# Patient Record
Sex: Female | Born: 1980 | Race: Black or African American | Hispanic: No | Marital: Single | Smoking: Never smoker
Health system: Southern US, Community
[De-identification: ages and names within clinical notes are randomized; demographics above are authoritative.]

---

## 2004-04-21 ENCOUNTER — Emergency Department (HOSPITAL_COMMUNITY): Admission: EM | Admit: 2004-04-21 | Discharge: 2004-04-21 | Payer: Self-pay | Admitting: Emergency Medicine

## 2017-10-03 ENCOUNTER — Ambulatory Visit: Payer: Self-pay | Admitting: Advanced Practice Midwife

## 2020-09-09 ENCOUNTER — Emergency Department
Admission: EM | Admit: 2020-09-09 | Discharge: 2020-09-09 | Disposition: A | Discharge status: Home / Self Care | Payer: BC Managed Care – PPO | Attending: Emergency Medicine | Admitting: Emergency Medicine

## 2020-09-09 ENCOUNTER — Other Ambulatory Visit: Payer: Self-pay

## 2020-09-09 ENCOUNTER — Emergency Department: Payer: BC Managed Care – PPO

## 2020-09-09 ENCOUNTER — Encounter: Payer: Self-pay | Admitting: Emergency Medicine

## 2020-09-09 DIAGNOSIS — R102 Pelvic and perineal pain: Secondary | ICD-10-CM

## 2020-09-09 DIAGNOSIS — Z3A00 Weeks of gestation of pregnancy not specified: Secondary | ICD-10-CM | POA: Insufficient documentation

## 2020-09-09 DIAGNOSIS — N939 Abnormal uterine and vaginal bleeding, unspecified: Secondary | ICD-10-CM

## 2020-09-09 DIAGNOSIS — O4691 Antepartum hemorrhage, unspecified, first trimester: Secondary | ICD-10-CM | POA: Diagnosis present

## 2020-09-09 DIAGNOSIS — O4690 Antepartum hemorrhage, unspecified, unspecified trimester: Secondary | ICD-10-CM

## 2020-09-09 LAB — COMPREHENSIVE METABOLIC PANEL
ALT: 26 U/L (ref 0–44)
AST: 28 U/L (ref 15–41)
Albumin: 3.8 g/dL (ref 3.5–5.0)
Alkaline Phosphatase: 53 U/L (ref 38–126)
Anion gap: 8 (ref 5–15)
BUN: 9 mg/dL (ref 6–20)
CO2: 23 mmol/L (ref 22–32)
Calcium: 8.5 mg/dL — ABNORMAL LOW (ref 8.9–10.3)
Chloride: 104 mmol/L (ref 98–111)
Creatinine, Ser: 0.84 mg/dL (ref 0.44–1.00)
GFR, Estimated: >60 mL/min (ref >60)
Glucose, Bld: 103 mg/dL — ABNORMAL HIGH (ref 70–99)
Potassium: 3.4 mmol/L — ABNORMAL LOW (ref 3.5–5.1)
Sodium: 135 mmol/L (ref 135–145)
Total Bilirubin: 0.5 mg/dL (ref 0.3–1.2)
Total Protein: 7.1 g/dL (ref 6.5–8.1)

## 2020-09-09 LAB — CBC WITH DIFFERENTIAL/PLATELET
Abs Immature Granulocytes: 0.01 10*3/uL (ref 0.00–0.07)
Basophils Absolute: 0 10*3/uL (ref 0.0–0.1)
Basophils Relative: 1 %
Eosinophils Absolute: 0 10*3/uL (ref 0.0–0.5)
Eosinophils Relative: 0 %
HCT: 32.9 % — ABNORMAL LOW (ref 36.0–46.0)
Hemoglobin: 11.5 g/dL — ABNORMAL LOW (ref 12.0–15.0)
Immature Granulocytes: 0 %
Lymphocytes Relative: 28 %
Lymphs Abs: 1 10*3/uL (ref 0.7–4.0)
MCH: 29.9 pg (ref 26.0–34.0)
MCHC: 35 g/dL (ref 30.0–36.0)
MCV: 85.5 fL (ref 80.0–100.0)
Monocytes Absolute: 0.3 10*3/uL (ref 0.1–1.0)
Monocytes Relative: 9 %
Neutro Abs: 2.2 10*3/uL (ref 1.7–7.7)
Neutrophils Relative %: 62 %
Platelets: 291 10*3/uL (ref 150–400)
RBC: 3.85 MIL/uL — ABNORMAL LOW (ref 3.87–5.11)
RDW: 14.3 % (ref 11.5–15.5)
WBC: 3.6 10*3/uL — ABNORMAL LOW (ref 4.0–10.5)
nRBC: 0 % (ref 0.0–0.2)

## 2020-09-09 LAB — HCG, QUANTITATIVE, PREGNANCY: hCG, Beta Chain, Quant, S: 4544 m[IU]/mL — ABNORMAL HIGH (ref <5)

## 2020-09-09 LAB — POC URINE PREG, ED: Preg Test, Ur: POSITIVE — AB

## 2020-09-09 NOTE — ED Triage Notes (Signed)
Pt reports that she is about a month pregnant, she began to bleed last night and this am she had clots and is cramping. She is also c/o frequent urination.

## 2020-09-09 NOTE — ED Notes (Signed)
Lab called to add blood work - CBC and CMP

## 2020-09-09 NOTE — Discharge Instructions (Signed)
Please return to the emergency department in 48 hours for repeat beta-hCG.

## 2020-09-09 NOTE — ED Provider Notes (Signed)
[facility]-EMERGENCY DEPARTMENT  ____________________________________________  Time seen: Approximately 10:56 PM  I have reviewed the triage vital signs and the nursing notes.   HISTORY  Chief Complaint Vaginal Bleeding   Historian Patient     HPI Breanna Hood is a 40 y.o. female G2, P0 presents to the emergency department with vaginal bleeding and cramping.  Patient states that she is approximately 1 month pregnant and had a positive pregnancy test last week.  Patient denies dysuria or low back pain.  No changes in vaginal discharge.  Patient denies abdominal pain.  No chest pain, chest tightness or abdominal pain.   History reviewed. No pertinent past medical history.   Immunizations up to date:  Yes.     History reviewed. No pertinent past medical history.  There are no problems to display for this patient.   History reviewed. No pertinent surgical history.  Prior to Admission medications   Not on File    Allergies Drug ingredient [acetaminophen] and Vicodin hp [hydrocodone-acetaminophen]  No family history on file.  Social History     Review of Systems  Constitutional: No fever/chills Eyes:  No discharge ENT: No upper respiratory complaints. Respiratory: no cough. No SOB/ use of accessory muscles to breath Gastrointestinal:   No nausea, no vomiting.  No diarrhea.  No constipation. Genitourinary: Patient has pelvic cramping.  Musculoskeletal: Negative for musculoskeletal pain. Skin: Negative for rash, abrasions, lacerations, ecchymosis.    ____________________________________________   PHYSICAL EXAM:  VITAL SIGNS: ED Triage Vitals  Enc Vitals Group     BP 09/09/20 1857 120/63     Pulse Rate 09/09/20 1857 85     Resp 09/09/20 1857 20     Temp 09/09/20 1857 99.9 F (37.7 C)     Temp Source 09/09/20 1857 Oral     SpO2 09/09/20 1857 100 %     Weight 09/09/20 1857 160 lb (72.6 kg)     Height 09/09/20 1857 5\' 2"  (1.575 m)     Head Circumference  --      Peak Flow --      Pain Score 09/09/20 1904 10     Pain Loc --      Pain Edu? --      Excl. in GC? --      Constitutional: Alert and oriented. Well appearing and in no acute distress. Eyes: Conjunctivae are normal. PERRL. EOMI. Head: Atraumatic. ENT:      Nose: No congestion/rhinnorhea.      Mouth/Throat: Mucous membranes are moist.  Neck: No stridor.  No cervical spine tenderness to palpation. Cardiovascular: Normal rate, regular rhythm. Normal S1 and S2.  Good peripheral circulation. Respiratory: Normal respiratory effort without tachypnea or retractions. Lungs CTAB. Good air entry to the bases with no decreased or absent breath sounds Gastrointestinal: Bowel sounds x 4 quadrants. Soft and nontender to palpation. No guarding or rigidity. No distention. Musculoskeletal: Full range of motion to all extremities. No obvious deformities noted Neurologic:  Normal for age. No gross focal neurologic deficits are appreciated.  Skin:  Skin is warm, dry and intact. No rash noted. Psychiatric: Mood and affect are normal for age. Speech and behavior are normal.   ____________________________________________   LABS (all labs ordered are listed, but only abnormal results are displayed)  Labs Reviewed  HCG, QUANTITATIVE, PREGNANCY - Abnormal; Notable for the following components:      Result Value   hCG, Beta Chain, Quant, S 4,544 (*)    All other components within normal limits  CBC  WITH DIFFERENTIAL/PLATELET - Abnormal; Notable for the following components:   WBC 3.6 (*)    RBC 3.85 (*)    Hemoglobin 11.5 (*)    HCT 32.9 (*)    All other components within normal limits  COMPREHENSIVE METABOLIC PANEL - Abnormal; Notable for the following components:   Potassium 3.4 (*)    Glucose, Bld 103 (*)    Calcium 8.5 (*)    All other components within normal limits  POC URINE PREG, ED - Abnormal; Notable for the following components:   Preg Test, Ur POSITIVE (*)    All other components  within normal limits  URINALYSIS, COMPLETE (UACMP) WITH MICROSCOPIC   ____________________________________________  EKG   ____________________________________________  RADIOLOGY Geraldo Pitter, personally viewed and evaluated these images (plain radiographs) as part of my medical decision making, as well as reviewing the written report by the radiologist.  US OB LESS THAN 14 WEEKS WITH OB TRANSVAGINAL  Result Date: 09/09/2020 CLINICAL DATA:  Pelvic pain and bleeding EXAM: OBSTETRIC <14 WK [place] AND TRANSVAGINAL OB US TECHNIQUE: Both transabdominal and transvaginal ultrasound examinations were performed for complete evaluation of the gestation as well as the maternal uterus, adnexal regions, and pelvic cul-de-sac. Transvaginal technique was performed to assess early pregnancy. COMPARISON:  None. FINDINGS: Intrauterine gestational sac: None Maternal uterus/adnexae: No extra uterine gestational sac is noted. The uterus demonstrates multiple fibroids the largest of which measures 2.6 cm. Additionally there is fullness in the region of the cervix with heterogeneous tissue which may represent a pedunculated fibroid. Some nabothian cysts are noted. No extra uterine gestational sac is seen. IMPRESSION: No evidence of intrauterine or extrauterine gestational sac. This likely represents a very early pregnancy. Correlate with serial beta HCG levels and follow-up ultrasound can be performed as clinically indicated. Uterine fibroids. There is heterogeneous tissue in the region of the endocervical canal which may represent a pedunculated fibroid. Electronically Signed   By: Alcide Clever M.D.   On: 09/09/2020 21:21    ____________________________________________    PROCEDURES  Procedure(s) performed:     Procedures     Medications - No data to display   ____________________________________________   INITIAL IMPRESSION / ASSESSMENT AND PLAN / ED COURSE  Pertinent labs & imaging results that  were available during my care of the patient were reviewed by me and considered in my medical decision making (see chart for details).      Assessment and plan Vaginal bleeding in pregnancy 40 year old female presents to the emergency department with [name] trimester vaginal bleeding.  Patient's beta-hCG was appropriately elevated.  There was no evidence of intrauterine pregnancy identified on ultrasound.  Patient's H&H were reassuring.  Advised patient to return to the emergency department in 3 days for repeat beta-hCG and possible dedicated ultrasound.  Patient voiced understanding has easy access to the emergency department.     ____________________________________________  FINAL CLINICAL IMPRESSION(S) / ED DIAGNOSES  Final diagnoses:  Vaginal bleeding in pregnancy      NEW MEDICATIONS STARTED DURING THIS VISIT:  ED Discharge Orders    None          This chart was dictated using voice recognition software/Dragon. Despite best efforts to proofread, errors can occur which can change the meaning. Any change was purely unintentional.     Orvil Feil, PA-C 09/09/20 4782    Phineas Semen, MD 09/09/20 [phone]

## 2020-09-15 ENCOUNTER — Other Ambulatory Visit: Payer: Self-pay

## 2020-09-15 ENCOUNTER — Encounter: Payer: Self-pay | Admitting: Obstetrics & Gynecology

## 2020-09-15 ENCOUNTER — Ambulatory Visit (INDEPENDENT_AMBULATORY_CARE_PROVIDER_SITE_OTHER): Payer: BC Managed Care – PPO | Admitting: Obstetrics & Gynecology

## 2020-09-15 VITALS — BP 120/70 | Ht 62.0 in | Wt 160.0 lb

## 2020-09-15 DIAGNOSIS — O209 Hemorrhage in early pregnancy, unspecified: Secondary | ICD-10-CM | POA: Diagnosis not present

## 2020-09-15 NOTE — Patient Instructions (Signed)
Vaginal Bleeding During Pregnancy, First Trimester A small amount of bleeding from the vagina, or spotting, is common during early pregnancy. Some bleeding may be related to the pregnancy, and some may not. In many cases, the bleeding is normal and is not a problem. However, bleeding can also be a sign of something serious. Normal things that may cause bleeding during the first trimester:  Implantation of the fertilized egg in the lining of the uterus.  Rapid changes in blood vessels. This is caused by changes that are happening to the body during pregnancy.  Sex.  Pelvic exams. Abnormal things that may cause bleeding during the first trimester include:  Infection or inflammation of the cervix.  Growths or polyps on the cervix.  Miscarriage or threatened miscarriage.  Pregnancy that is growing outside of the uterus (ectopic pregnancy).  A fertilized egg that becomes a mass of tissue (molar pregnancy). Tell your health care provider right away if there is any bleeding from your vagina. Follow these instructions at home: Monitoring your bleeding Monitor your bleeding.  Pay attention to any changes in your symptoms. Let your health care provider know about any concerns.  Try to understand when the bleeding occurs. Does the bleeding start on its own, or does it start after something is done, such as sex or a pelvic exam?  Use a diary to record the things you see about your bleeding, including: ? The kind of bleeding you are having. Does the bleeding start and stop irregularly, or is it a constant flow? ? The severity of your bleeding. Is the bleeding heavy or light? ? The number of pads you use each day, how often you change them, and how soaked they are.  Tell your health care provider if you pass tissue. He or she may want to see it.   Activity  Follow instructions from your health care provider about limiting your activity. Ask what activities are safe for you.  Do not have  sex until your health care provider says that this is safe.  If needed, make plans for someone to help with your regular activities. General instructions  Take over-the-counter and prescription medicines only as told by your health care provider.  Do not take aspirin because it can cause bleeding.  Do not use tampons or douche.  Keep all follow-up visits. This is important. Contact a health care provider if:  You have vaginal bleeding during any part of your pregnancy.  You have cramps or labor pains.  You have a fever or chills. Get help right away if:  You have severe cramps in your back or abdomen.  You pass large clots or a large amount of tissue from your vagina.  Your bleeding increases.  You feel light-headed or weak, or you faint.  You are leaking fluid or have a gush of fluid from your vagina. Summary  A small amount of bleeding from the vagina is common during early pregnancy.  Be sure to tell your health care provider about any vaginal bleeding right away.  Try to understand when bleeding occurs. Does bleeding occur on its own, or does it occur after something is done, such as sex or pelvic exams?  Keep all follow-up visits. This is important. This information is not intended to replace advice given to you by your health care provider. Make sure you discuss any questions you have with your health care provider. Document Revised: 12/18/2019 Document Reviewed: 12/18/2019 Elsevier Patient Education  2021 Elsevier Inc.  

## 2020-09-15 NOTE — Progress Notes (Signed)
Obstetric Problem Visit   Chief Complaint: First trimester bleeding  History of Present Illness: Patient is a 40 y.o. G2P0010 Unknown LMP (?Apr 20) presenting for first trimester bleeding.  The onset of bleeding was one week ago.  Stopped one day after 6/2 ER visit.  No nausea or vomiting or breast T.  Is bleeding equal to or greater than normal menstrual flow:  Yes Any recent trauma:  No Recent intercourse:  No History of prior miscarriage:  No (prior EAb) Prior ultrasound demonstrating IUP:  No Prior ultrasound demonstrating viable IUP:  No Prior Serum HCG:  Yes 4500 on 09/09/20 Rh status: uncertain  PMHx: She  has no past medical history on file. Also,  has no past surgical history on file., family history includes Colon cancer in her maternal grandfather.,  reports that she has never smoked. She has never used smokeless tobacco. She reports that she does not drink alcohol and does not use drugs.  She currently has no medications in their medication list. Also, is allergic to drug ingredient [acetaminophen] and vicodin hp [hydrocodone-acetaminophen].  Review of Systems  Constitutional: Negative for chills, fever and malaise/fatigue.  HENT: Negative for congestion, sinus pain and sore throat.   Eyes: Negative for blurred vision and pain.  Respiratory: Negative for cough and wheezing.   Cardiovascular: Negative for chest pain and leg swelling.  Gastrointestinal: Negative for abdominal pain, constipation, diarrhea, heartburn, nausea and vomiting.  Genitourinary: Negative for dysuria, frequency, hematuria and urgency.  Musculoskeletal: Negative for back pain, joint pain, myalgias and neck pain.  Skin: Negative for itching and rash.  Neurological: Negative for dizziness, tremors and weakness.  Endo/Heme/Allergies: Does not bruise/bleed easily.  Psychiatric/Behavioral: Negative for depression. The patient is not nervous/anxious and does not have insomnia.     Objective: Vitals:    09/15/20 0835  BP: 120/70   Physical Exam Constitutional:      General: She is not in acute distress.    Appearance: She is well-developed.  Musculoskeletal:        General: Normal range of motion.  Neurological:     Mental Status: She is alert and oriented to person, place, and time.  Skin:    General: Skin is warm and dry.  Vitals reviewed.     Assessment: 40 y.o. G2P0010 Unknown 1. First trimester bleeding   Plan: Problem List Items Addressed This Visit     First trimester bleeding    -  Primary    Likely miscarriage, will check beta and possibly another [place]  1) First trimester bleeding - incidence and clinical course of first trimester bleeding is discussed in detail with the patient today.  Approximately 1/3 of pregnancies ending in live births experienced 1st trimester bleeding.  The amount of bleeding is variable and not necessarily predictive of outcome.  Sources may be cervical or uterine.  Subchorionic hemorrhages are a frequent concurrent findings on ultrasound and are followed expectantly.  These often absorb or regress spontaneously although risk for expansion and further disruption of the utero-placental interface leading to miscarriage is possible.  There is no clearly documented benefit to limiting or modifying activity and sexual intercourse in altering clinic course of 1st trimester bleeding.    2) If not already done will proceed with TVUS evaluation to document viability, and if uncertain viability or absence of a demonstrable IUP (and no previous documentation of IUP) will trend HCG levels.  3) The patient is Rh uncertain so will check today;  rhogam is therefore may  be indicated to decrease the risk rhesus alloimmunization.    4) Routine bleeding precautions were discussed with the patient prior the conclusion of today's visit.  Annamarie Major, MD, [facility], [facility] 09/15/2020  9:02 AM

## 2020-09-16 LAB — ABO AND RH: Rh Factor: POSITIVE

## 2020-09-16 LAB — BETA HCG QUANT (REF LAB): hCG Quant: 196 m[IU]/mL

## 2020-09-22 ENCOUNTER — Ambulatory Visit: Payer: BC Managed Care – PPO | Admitting: Obstetrics & Gynecology

## 2022-06-01 IMAGING — US US OB < 14 WEEKS - US OB TV
1 series · 14 of 28 positions shown · non-contrast
Comparison: None.

CLINICAL DATA: Pelvic pain and bleeding

EXAM:
OBSTETRIC <14 WK US AND TRANSVAGINAL OB US
TECHNIQUE: Both transabdominal and transvaginal ultrasound examinations were
performed for complete evaluation of the gestation as well as the
maternal uterus, adnexal regions, and pelvic cul-de-sac.
Transvaginal technique was performed to assess early pregnancy.

[Series 1: us ob comp less 14 wks · 99 acquisitions, 14 frames shown]
[im 4/99]
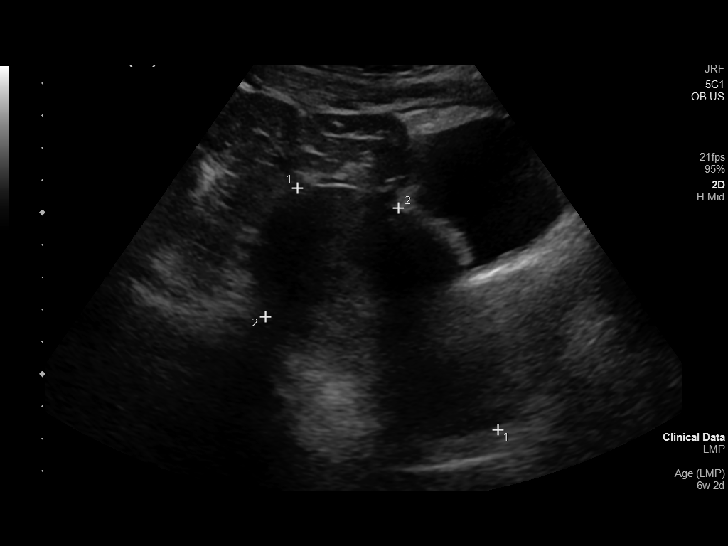
[im 11/99]
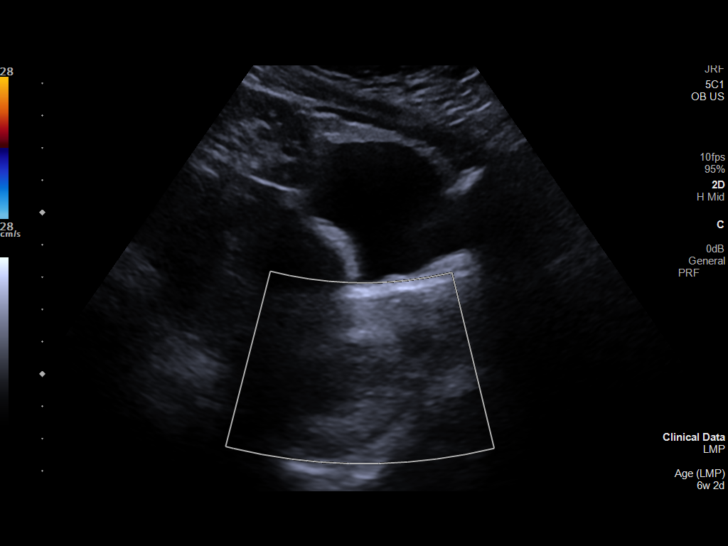
[im 19/99]
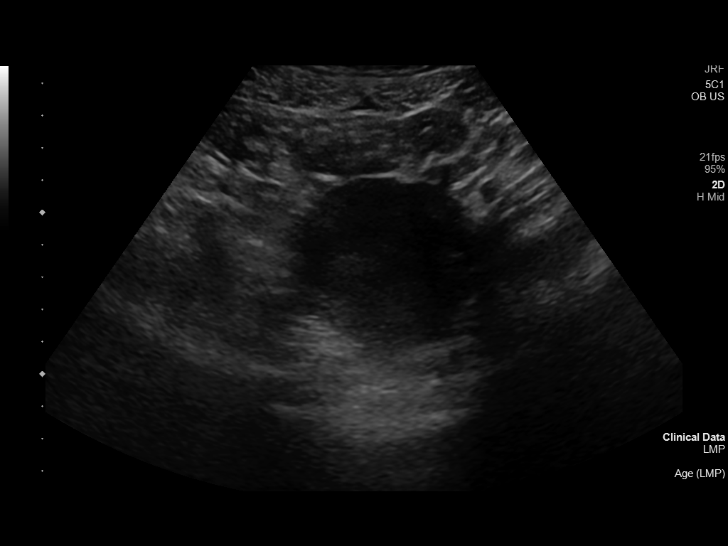
[im 26/99]
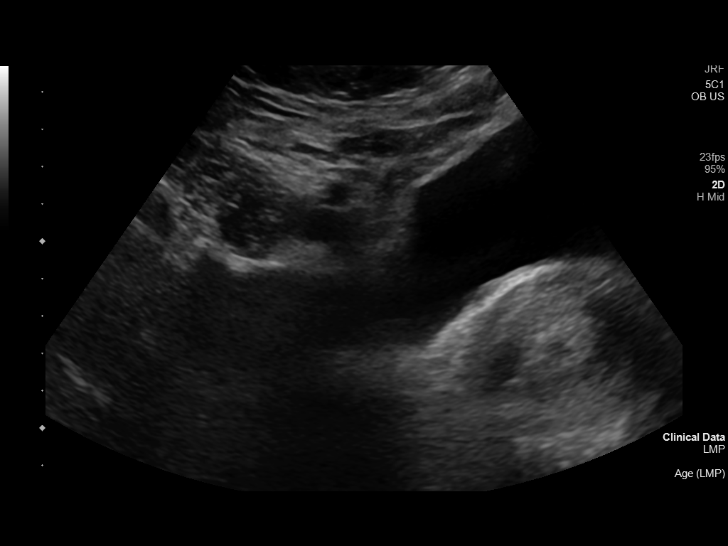
[im 33/99]
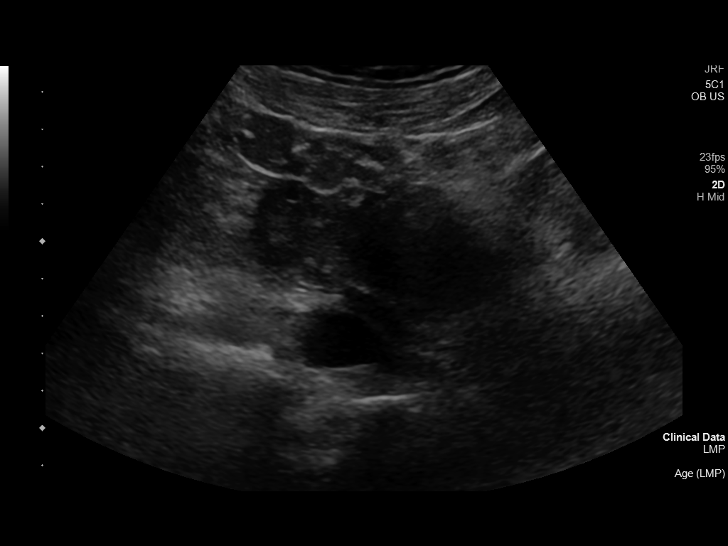
[im 40/99]
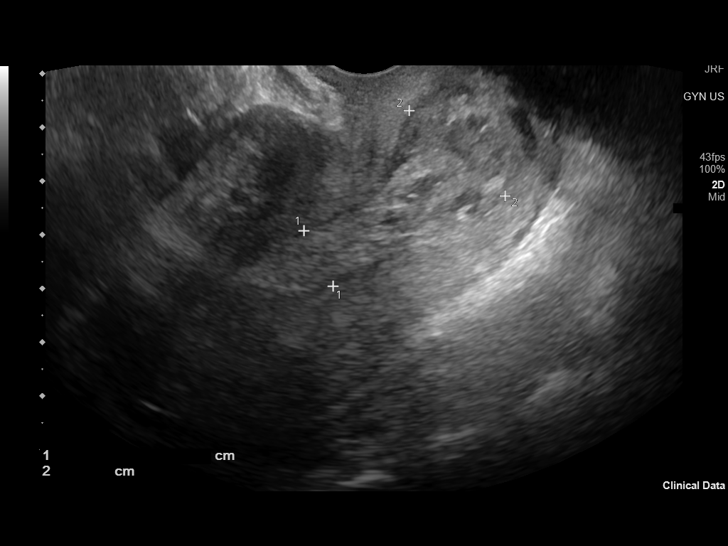
[im 48/99]
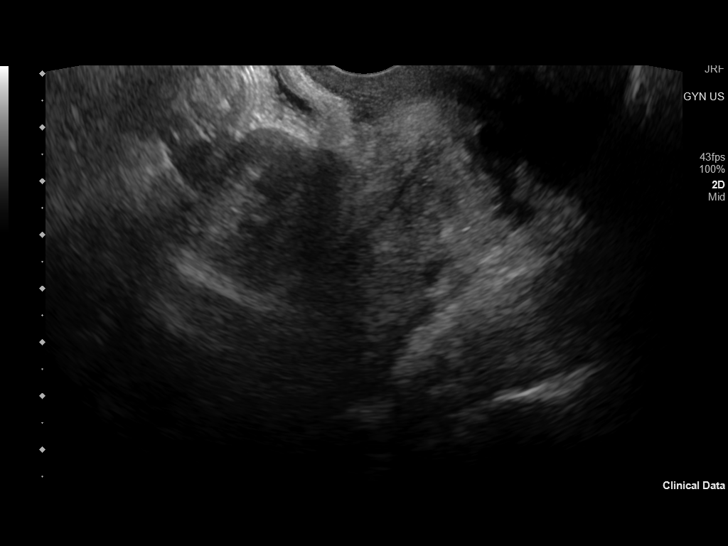
[im 55/99]
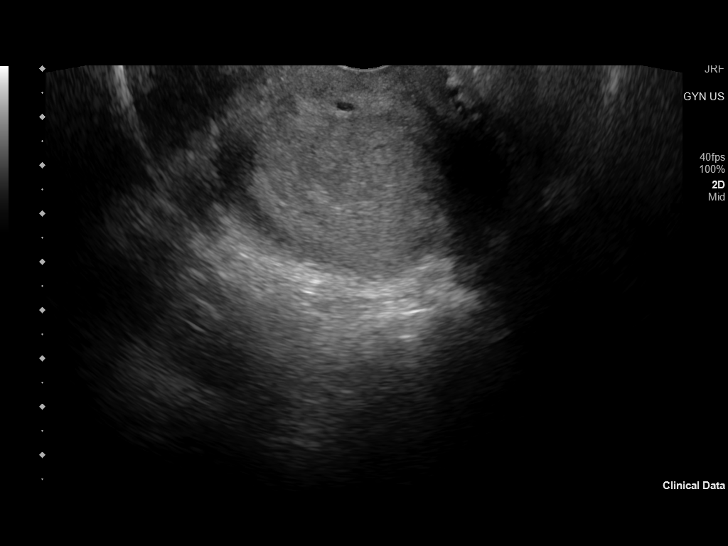
[im 62/99]
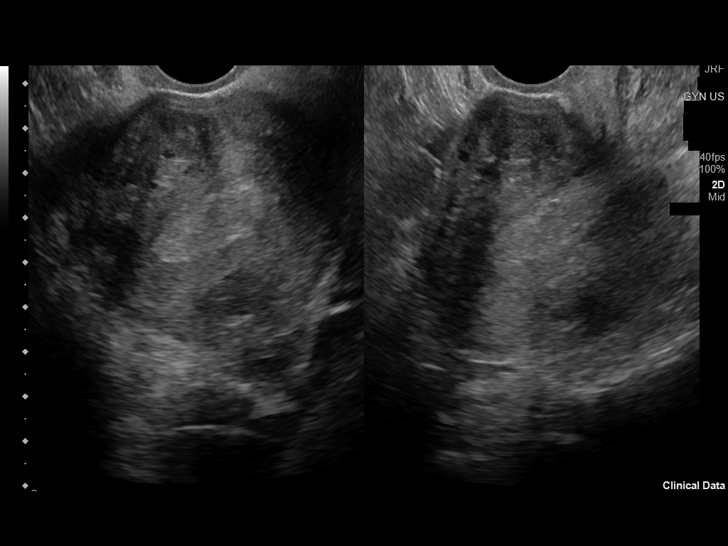
[im 69/99]
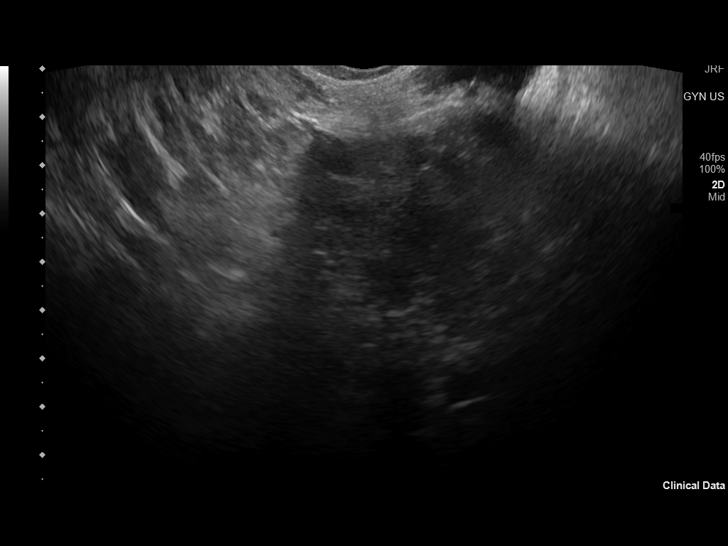
[im 77/99]
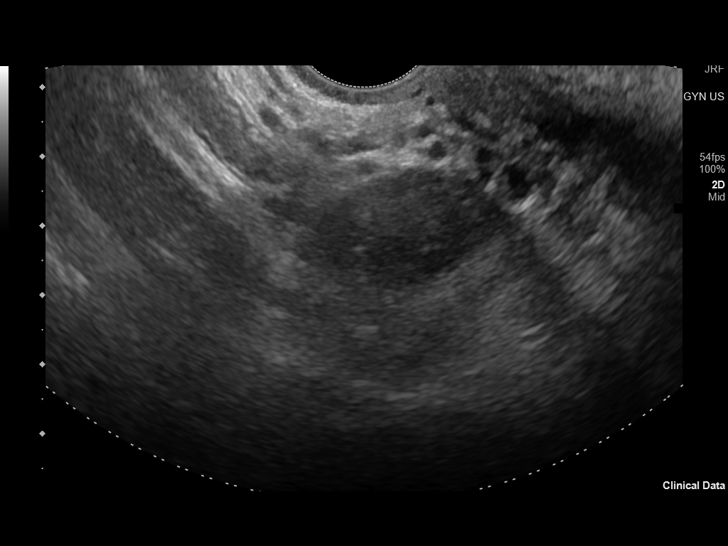
[im 84/99]
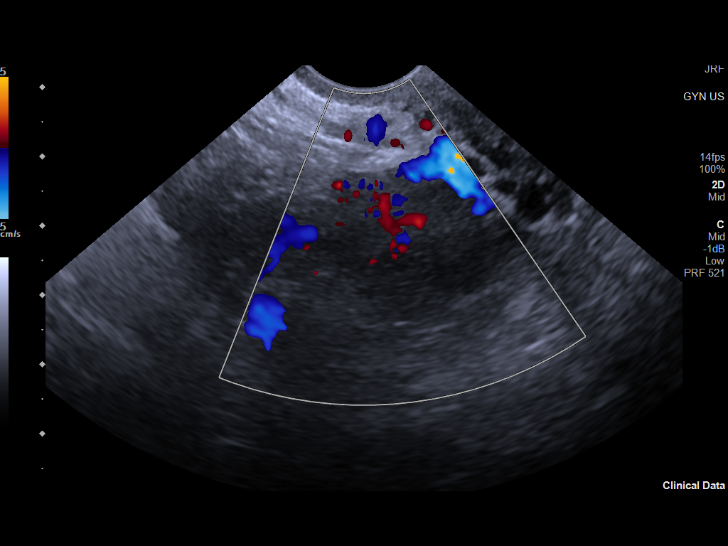
[im 91/99]
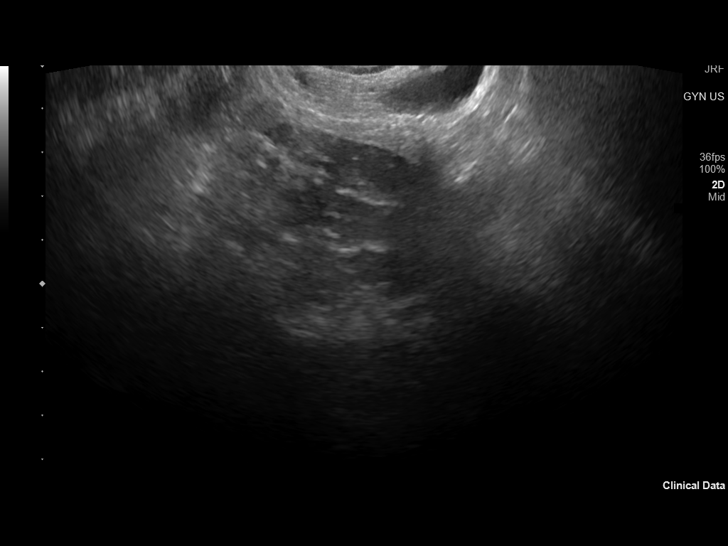
[im 99/99]
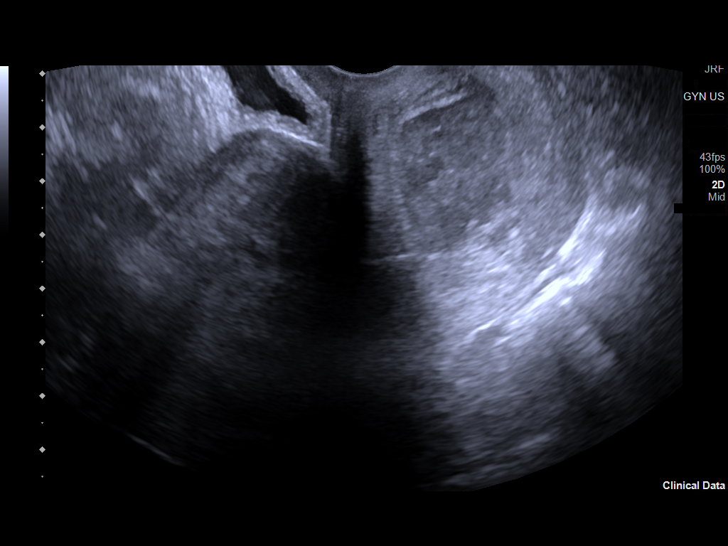

[14 of 28 positions shown; findings below may reference images not displayed]

FINDINGS: Intrauterine gestational sac: None

Maternal uterus/adnexae: No extra uterine gestational sac is noted.
The uterus demonstrates multiple fibroids the largest of which
measures 2.6 cm. Additionally there is fullness in the region of the
cervix with heterogeneous tissue which may represent a pedunculated
fibroid. Some nabothian cysts are noted. No extra uterine
gestational sac is seen.
IMPRESSION: No evidence of intrauterine or extrauterine gestational sac. This
likely represents a very early pregnancy. Correlate with serial beta
HCG levels and follow-up ultrasound can be performed as clinically
indicated.

Uterine fibroids. There is heterogeneous tissue in the region of the
endocervical canal which may represent a pedunculated fibroid.

## 2022-09-25 ENCOUNTER — Ambulatory Visit
Admission: RE | Admit: 2022-09-25 | Discharge: 2022-09-25 | Disposition: A | Discharge status: Home / Self Care | Payer: BC Managed Care – PPO | Source: Ambulatory Visit | Attending: Internal Medicine | Admitting: Internal Medicine

## 2022-09-25 ENCOUNTER — Encounter: Payer: Self-pay | Admitting: Internal Medicine

## 2022-09-25 ENCOUNTER — Ambulatory Visit (INDEPENDENT_AMBULATORY_CARE_PROVIDER_SITE_OTHER): Payer: BC Managed Care – PPO | Admitting: Internal Medicine

## 2022-09-25 ENCOUNTER — Ambulatory Visit
Admission: RE | Admit: 2022-09-25 | Discharge: 2022-09-25 | Disposition: A | Discharge status: Home / Self Care | Payer: BC Managed Care – PPO | Attending: Internal Medicine | Admitting: Internal Medicine

## 2022-09-25 VITALS — BP 120/82 | Ht 62.0 in | Wt 166.8 lb

## 2022-09-25 DIAGNOSIS — M25472 Effusion, left ankle: Secondary | ICD-10-CM

## 2022-09-25 DIAGNOSIS — M25572 Pain in left ankle and joints of left foot: Secondary | ICD-10-CM

## 2022-09-25 MED ORDER — METHYLPREDNISOLONE 4 MG PO TBPK: ORAL_TABLET | ORAL | 0 refills | Status: DC

## 2022-09-25 MED ORDER — MELOXICAM 15 MG PO TABS: 15.0000 mg | ORAL_TABLET | Freq: Every day | ORAL | 0 refills | Status: DC

## 2022-09-25 NOTE — Progress Notes (Signed)
Established Patient Office Visit  Subjective:  Patient ID: Breanna Hood, female    DOB: 1980/07/30  Age: 42 y.o. MRN: [id number]  Chief Complaint  Patient presents with   Foot Swelling    Left foot swollen    Patient not in since 2019. Has been staying well. Pt comes in now with 2 week h/x of left ankle swelling and pain. No recent injury but somebody stepped on her left foot few weeks ago. Patient also has a remote history of left foot injury, but no fracture. Tried icing, and otc NSAIDs. Left ankle and foot are swollen- no bruising, but somewhat tender to touch. Will check xray. If negative then try Medrol dose pk, Meloxicam after, ACE-WRAP, ice and keep foot elevated.    No other concerns at this time.   History reviewed. No pertinent past medical history.  History reviewed. No pertinent surgical history.  Social History   Socioeconomic History   Marital status: Single    Spouse name: Not on file   Number of children: Not on file   Years of education: Not on file   Highest education level: Not on file  Occupational History   Not on file  Tobacco Use   Smoking status: Never   Smokeless tobacco: Never  Vaping Use   Vaping Use: Never used  Substance and Sexual Activity   Alcohol use: Never   Drug use: Never   Sexual activity: Not Currently  Other Topics Concern   Not on file  Social History Narrative   Not on file   Social Determinants of Health   Financial Resource Strain: Not on file  Food Insecurity: Not on file  Transportation Needs: Not on file  Physical Activity: Not on file  Stress: Not on file  Social Connections: Not on file  Intimate Partner Violence: Not on file    Family History  Problem Relation Age of Onset   Colon cancer Maternal Grandfather     Allergies  Allergen Reactions   Codeine Other (See Comments)   Drug Ingredient [Acetaminophen]     Tylenol with codeine    Vicodin Hp [Hydrocodone-Acetaminophen]     Review of  Systems  Constitutional:  Negative for chills, diaphoresis, fever, malaise/fatigue and weight loss.  HENT: Negative.    Eyes: Negative.   Respiratory:  Negative for cough, shortness of breath and wheezing.   Cardiovascular:  Negative for chest pain, palpitations, claudication, leg swelling and PND.  Gastrointestinal: Negative.   Genitourinary: Negative.   Musculoskeletal:  Positive for joint pain. Negative for falls, myalgias and neck pain.  Skin: Negative.   Neurological:  Negative for dizziness.  Psychiatric/Behavioral: Negative.         Objective:   BP 120/82   Ht 5\' 2"  (1.575 m)   Wt 166 lb 12.8 oz (75.7 kg)   LMP 09/21/2022   BMI 30.51 kg/m   Vitals:   09/25/22 1516  BP: 120/82  Height: 5\' 2"  (1.575 m)  Weight: 166 lb 12.8 oz (75.7 kg)  BMI (Calculated): 30.5    Physical Exam Vitals and nursing note reviewed.  Constitutional:      Appearance: Normal appearance.  HENT:     Head: Normocephalic and atraumatic.  Cardiovascular:     Rate and Rhythm: Normal rate and regular rhythm.     Pulses: Normal pulses.     Heart sounds: Normal heart sounds. No murmur heard. Pulmonary:     Effort: Pulmonary effort is normal.     Breath sounds:  Normal breath sounds. No wheezing.  Abdominal:     General: Bowel sounds are normal.     Palpations: Abdomen is soft.  Musculoskeletal:        General: Swelling present. No signs of injury. Normal range of motion.     Cervical back: Normal range of motion and neck supple.     Right lower leg: No edema.  Skin:    General: Skin is warm and dry.  Neurological:     General: No focal deficit present.     Mental Status: She is alert and oriented to person, place, and time.  Psychiatric:        Mood and Affect: Mood normal.        Behavior: Behavior normal.      No results found for any visits on 09/25/22.  No results found for this or any previous visit (from the past 2160 hour(s)).    Assessment & Plan:  Xray Negative. Will  treat as sprain. Problem List Items Addressed This Visit   None Visit Diagnoses     Left ankle swelling    -  Primary   Relevant Medications   methylPREDNISolone (MEDROL DOSEPAK) 4 MG TBPK tablet   meloxicam (MOBIC) 15 MG tablet   Other Relevant Orders   DG Ankle Complete Left (Completed)   Left lateral ankle pain       Relevant Medications   meloxicam (MOBIC) 15 MG tablet       Return in about 10 days (around 10/05/2022).   Total time spent: 30 minutes  Margaretann Loveless, MD  09/25/2022   This document may have been prepared by [facility] Voice Recognition software and as such may include unintentional dictation errors.

## 2022-10-05 ENCOUNTER — Ambulatory Visit: Payer: BC Managed Care – PPO | Admitting: Internal Medicine

## 2022-10-06 ENCOUNTER — Ambulatory Visit
Admission: RE | Admit: 2022-10-06 | Discharge: 2022-10-06 | Disposition: A | Discharge status: Home / Self Care | Payer: BC Managed Care – PPO | Attending: Internal Medicine | Admitting: Internal Medicine

## 2022-10-06 ENCOUNTER — Ambulatory Visit
Admission: RE | Admit: 2022-10-06 | Discharge: 2022-10-06 | Disposition: A | Discharge status: Home / Self Care | Payer: BC Managed Care – PPO | Source: Ambulatory Visit | Attending: Internal Medicine | Admitting: Internal Medicine

## 2022-10-06 ENCOUNTER — Encounter: Payer: Self-pay | Admitting: Internal Medicine

## 2022-10-06 ENCOUNTER — Ambulatory Visit: Payer: BC Managed Care – PPO | Admitting: Internal Medicine

## 2022-10-06 VITALS — BP 119/70 | HR 69 | Ht 62.0 in | Wt 169.2 lb

## 2022-10-06 DIAGNOSIS — Z13220 Encounter for screening for lipoid disorders: Secondary | ICD-10-CM

## 2022-10-06 DIAGNOSIS — M25572 Pain in left ankle and joints of left foot: Secondary | ICD-10-CM | POA: Diagnosis present

## 2022-10-06 DIAGNOSIS — M25472 Effusion, left ankle: Secondary | ICD-10-CM | POA: Insufficient documentation

## 2022-10-06 DIAGNOSIS — E559 Vitamin D deficiency, unspecified: Secondary | ICD-10-CM | POA: Diagnosis not present

## 2022-10-06 DIAGNOSIS — Z1231 Encounter for screening mammogram for malignant neoplasm of breast: Secondary | ICD-10-CM

## 2022-10-06 NOTE — Progress Notes (Signed)
Spoke with patient and verbalized understanding.

## 2022-10-06 NOTE — Progress Notes (Signed)
Established Patient Office Visit  Subjective:  Patient ID: Breanna Hood, female    DOB: [birth year]  Age: 42 y.o. MRN: [id number]  Chief Complaint  Patient presents with   Follow-up    2 week F/U    Patient in office for 10 day follow up. Patient continues to have left foot edema on the top of her foot, lateral side. Patient tried icing, meloxicam and completed the prednisone as prescribed. Denies pain. Patient placed ice pack directly on foot causing a small ice burn. Xray of ankle was unremarkable. Xray of top of her foot was not performed. Will order complete xray of the foot today.  Xray Foot Also Negative - Patient due for a mammogram, blood work and pap smear. Patient fasting, will order blood work today.    No other concerns at this time.   History reviewed. No pertinent past medical history.  History reviewed. No pertinent surgical history.  Social History   Socioeconomic History   Marital status: Single    Spouse name: Not on file   Number of children: Not on file   Years of education: Not on file   Highest education level: Not on file  Occupational History   Not on file  Tobacco Use   Smoking status: Never   Smokeless tobacco: Never  Vaping Use   Vaping Use: Never used  Substance and Sexual Activity   Alcohol use: Never   Drug use: Never   Sexual activity: Not Currently  Other Topics Concern   Not on file  Social History Narrative   Not on file   Social Determinants of Health   Financial Resource Strain: Not on file  Food Insecurity: Not on file  Transportation Needs: Not on file  Physical Activity: Not on file  Stress: Not on file  Social Connections: Not on file  Intimate Partner Violence: Not on file    Family History  Problem Relation Age of Onset   Colon cancer Maternal Grandfather     Allergies  Allergen Reactions   Codeine Other (See Comments)   Drug Ingredient [Acetaminophen]     Tylenol with codeine    Vicodin Hp  [Hydrocodone-Acetaminophen]     Review of Systems  Constitutional: Negative.   HENT: Negative.    Eyes: Negative.   Respiratory: Negative.  Negative for cough and shortness of breath.   Cardiovascular: Negative.  Negative for chest pain, palpitations and leg swelling.  Gastrointestinal: Negative.  Negative for abdominal pain, constipation, diarrhea, heartburn, nausea and vomiting.  Genitourinary: Negative.  Negative for dysuria and flank pain.  Musculoskeletal: Negative.  Negative for joint pain and myalgias.       Left foot edema  Skin: Negative.   Neurological: Negative.  Negative for dizziness and headaches.  Endo/Heme/Allergies: Negative.   Psychiatric/Behavioral: Negative.  Negative for depression and suicidal ideas. The patient is not nervous/anxious.        Objective:   BP 119/70   Pulse 69   Ht 5\' 2"  (1.575 m)   Wt 169 lb 3.2 oz (76.7 kg)   LMP 09/21/2022   SpO2 98%   BMI 30.95 kg/m   Vitals:   10/06/22 0954  BP: 119/70  Pulse: 69  Height: 5\' 2"  (1.575 m)  Weight: 169 lb 3.2 oz (76.7 kg)  SpO2: 98%  BMI (Calculated): 30.94    Physical Exam Vitals and nursing note reviewed.  Constitutional:      Appearance: Normal appearance.  HENT:     Head: Normocephalic and  atraumatic.     Nose: Nose normal.  Cardiovascular:     Rate and Rhythm: Normal rate and regular rhythm.     Pulses: Normal pulses.     Heart sounds: Normal heart sounds. No murmur heard. Pulmonary:     Effort: Pulmonary effort is normal.     Breath sounds: Normal breath sounds. No wheezing.  Abdominal:     General: Bowel sounds are normal.     Palpations: Abdomen is soft.     Tenderness: There is no abdominal tenderness. There is no right CVA tenderness or left CVA tenderness.  Musculoskeletal:        General: Signs of injury present. Normal range of motion.     Cervical back: Normal range of motion.     Right lower leg: No edema.     Left lower leg: Edema present.  Skin:    General:  Skin is warm and dry.  Neurological:     General: No focal deficit present.     Mental Status: She is alert and oriented to person, place, and time.  Psychiatric:        Mood and Affect: Mood normal.        Behavior: Behavior normal.      No results found for any visits on 10/06/22.  No results found for this or any previous visit (from the past 2160 hour(s)).    Assessment & Plan:  Xray left foot also negative. Continue meloxicam as prescribed. Fasting labs today. Mammogram ordered. Pap smear at next visit.   Problem List Items Addressed This Visit     Lipid screening   Relevant Orders   Lipid Profile   Vitamin D deficiency   Relevant Orders   Vitamin D (25 hydroxy)   CBC With Diff/Platelet   CMP14+EGFR   Left lateral ankle pain   Relevant Orders   DG Foot Complete Left (Completed)   Left ankle swelling - Primary    Return in about 2 weeks (around 10/20/2022) for with pap smear.   Total time spent: 25 minutes  Margaretann Loveless, MD  10/06/2022   This document may have been prepared by [facility] Voice Recognition software and as such may include unintentional dictation errors.

## 2022-10-07 LAB — CBC WITH DIFF/PLATELET
Basophils Absolute: 0.1 10*3/uL (ref 0.0–0.2)
Basos: 1 %
EOS (ABSOLUTE): 0.2 10*3/uL (ref 0.0–0.4)
Eos: 5 %
Hematocrit: 35.7 % (ref 34.0–46.6)
Hemoglobin: 11.6 g/dL (ref 11.1–15.9)
Immature Grans (Abs): 0 10*3/uL (ref 0.0–0.1)
Immature Granulocytes: 0 %
Lymphocytes Absolute: 1.8 10*3/uL (ref 0.7–3.1)
Lymphs: 45 %
MCH: 28.1 pg (ref 26.6–33.0)
MCHC: 32.5 g/dL (ref 31.5–35.7)
MCV: 86 fL (ref 79–97)
Monocytes Absolute: 0.3 10*3/uL (ref 0.1–0.9)
Monocytes: 9 %
Neutrophils Absolute: 1.6 10*3/uL (ref 1.4–7.0)
Neutrophils: 40 %
Platelets: 314 10*3/uL (ref 150–450)
RBC: 4.13 x10E6/uL (ref 3.77–5.28)
RDW: 14.9 % (ref 11.7–15.4)
WBC: 3.9 10*3/uL (ref 3.4–10.8)

## 2022-10-07 LAB — CMP14+EGFR
ALT: 16 IU/L (ref 0–32)
AST: 19 IU/L (ref 0–40)
Albumin: 4.1 g/dL (ref 3.9–4.9)
Alkaline Phosphatase: 67 IU/L (ref 44–121)
BUN/Creatinine Ratio: 15 (ref 9–23)
BUN: 14 mg/dL (ref 6–24)
Bilirubin Total: 0.3 mg/dL (ref 0.0–1.2)
CO2: 22 mmol/L (ref 20–29)
Calcium: 9.1 mg/dL (ref 8.7–10.2)
Chloride: 106 mmol/L (ref 96–106)
Creatinine, Ser: 0.95 mg/dL (ref 0.57–1.00)
Globulin, Total: 2.9 g/dL (ref 1.5–4.5)
Glucose: 84 mg/dL (ref 70–99)
Potassium: 4.5 mmol/L (ref 3.5–5.2)
Sodium: 139 mmol/L (ref 134–144)
Total Protein: 7 g/dL (ref 6.0–8.5)
eGFR: 78 mL/min/{1.73_m2} (ref >59)

## 2022-10-07 LAB — LIPID PANEL
Chol/HDL Ratio: 2.2 ratio (ref 0.0–4.4)
Cholesterol, Total: 188 mg/dL (ref 100–199)
HDL: 84 mg/dL (ref >39)
LDL Chol Calc (NIH): 93 mg/dL (ref 0–99)
Triglycerides: 59 mg/dL (ref 0–149)
VLDL Cholesterol Cal: 11 mg/dL (ref 5–40)

## 2022-10-07 LAB — VITAMIN D 25 HYDROXY (VIT D DEFICIENCY, FRACTURES): Vit D, 25-Hydroxy: 12.3 ng/mL — ABNORMAL LOW (ref 30.0–100.0)

## 2022-10-09 ENCOUNTER — Other Ambulatory Visit: Payer: Self-pay | Admitting: Internal Medicine

## 2022-10-09 DIAGNOSIS — E559 Vitamin D deficiency, unspecified: Secondary | ICD-10-CM

## 2022-10-09 MED ORDER — VITAMIN D3 1.25 MG (50000 UT) PO CAPS: 1.0000 | ORAL_CAPSULE | ORAL | 3 refills | Status: AC

## 2022-10-09 NOTE — Progress Notes (Signed)
Patient notified

## 2022-10-22 ENCOUNTER — Other Ambulatory Visit: Payer: Self-pay | Admitting: Internal Medicine

## 2022-10-22 DIAGNOSIS — M25572 Pain in left ankle and joints of left foot: Secondary | ICD-10-CM

## 2022-10-22 DIAGNOSIS — M25472 Effusion, left ankle: Secondary | ICD-10-CM

## 2022-10-26 ENCOUNTER — Ambulatory Visit (INDEPENDENT_AMBULATORY_CARE_PROVIDER_SITE_OTHER): Payer: BC Managed Care – PPO | Admitting: Internal Medicine

## 2022-10-26 ENCOUNTER — Encounter: Payer: Self-pay | Admitting: Internal Medicine

## 2022-10-26 VITALS — BP 130/80 | HR 65 | Ht 62.0 in | Wt 166.6 lb

## 2022-10-26 DIAGNOSIS — Z202 Contact with and (suspected) exposure to infections with a predominantly sexual mode of transmission: Secondary | ICD-10-CM | POA: Diagnosis not present

## 2022-10-26 DIAGNOSIS — E559 Vitamin D deficiency, unspecified: Secondary | ICD-10-CM

## 2022-10-26 DIAGNOSIS — Z13220 Encounter for screening for lipoid disorders: Secondary | ICD-10-CM

## 2022-10-26 DIAGNOSIS — Z1272 Encounter for screening for malignant neoplasm of vagina: Secondary | ICD-10-CM

## 2022-10-26 DIAGNOSIS — Z0000 Encounter for general adult medical examination without abnormal findings: Secondary | ICD-10-CM

## 2022-10-26 LAB — POCT URINALYSIS DIPSTICK
Bilirubin, UA: NEGATIVE
Blood, UA: NEGATIVE
Glucose, UA: NEGATIVE
Ketones, UA: NEGATIVE
Nitrite, UA: NEGATIVE
Protein, UA: NEGATIVE
Spec Grav, UA: 1.015 (ref 1.010–1.025)
Urobilinogen, UA: 0.2 E.U./dL
pH, UA: 7 (ref 5.0–8.0)

## 2022-10-26 NOTE — Progress Notes (Signed)
Established Patient Office Visit  Subjective:  Patient ID: Breanna Hood, female    DOB: [birth year]  Age: 42 y.o. MRN: [id number]  Chief Complaint  Patient presents with   2 Week Follow-up   PAP    Patient comes in for her follow-up and also for a complete physical.  Her last Pap smear was before 2019.  She also wants to be swabbed for STIs.  Patient will get a complete breast exam today but she declines mammogram. She had blood work done last week and it showed a very low vitamin D level.  She has been started on supplemental vitamin D, says she is taking regularly. Left foot swelling had gone down nicely.  Both x-rays of the foot and the ankle were negative.  However she had a bug bite on the top of the same foot which was slightly swollen again.  Is not itching or burning and there is no rash at this time. There are no other complaints.    No other concerns at this time.   History reviewed. No pertinent past medical history.  History reviewed. No pertinent surgical history.  Social History   Socioeconomic History   Marital status: Single    Spouse name: Not on file   Number of children: Not on file   Years of education: Not on file   Highest education level: Not on file  Occupational History   Not on file  Tobacco Use   Smoking status: Never   Smokeless tobacco: Never  Vaping Use   Vaping status: Never Used  Substance and Sexual Activity   Alcohol use: Never   Drug use: Never   Sexual activity: Not Currently  Other Topics Concern   Not on file  Social History Narrative   Not on file   Social Determinants of Health   Financial Resource Strain: Not on file  Food Insecurity: Not on file  Transportation Needs: Not on file  Physical Activity: Not on file  Stress: Not on file  Social Connections: Not on file  Intimate Partner Violence: Not on file    Family History  Problem Relation Age of Onset   Colon cancer Maternal Grandfather     Allergies   Allergen Reactions   Codeine Other (See Comments)   Drug Ingredient [Acetaminophen]     Tylenol with codeine    Vicodin Hp [Hydrocodone-Acetaminophen]     Review of Systems  Constitutional: Negative.  Negative for chills, diaphoresis, fever and weight loss.  HENT: Negative.  Negative for congestion, ear discharge, nosebleeds, sinus pain and tinnitus.   Eyes: Negative.  Negative for blurred vision.  Respiratory: Negative.  Negative for cough, shortness of breath, wheezing and stridor.   Cardiovascular: Negative.  Negative for chest pain, palpitations and leg swelling.  Gastrointestinal: Negative.  Negative for abdominal pain, constipation, diarrhea, heartburn, nausea and vomiting.  Genitourinary: Negative.  Negative for dysuria, flank pain, frequency and urgency.  Musculoskeletal: Negative.  Negative for myalgias and neck pain.  Skin: Negative.  Negative for itching and rash.  Neurological: Negative.  Negative for dizziness, tingling and headaches.  Endo/Heme/Allergies: Negative.   Psychiatric/Behavioral: Negative.  Negative for depression and suicidal ideas. The patient is not nervous/anxious.        Objective:   BP 130/80   Pulse 65   Ht 5\' 2"  (1.575 m)   Wt 166 lb 9.6 oz (75.6 kg)   SpO2 97%   BMI 30.47 kg/m   Vitals:   10/26/22 0859  BP:  130/80  Pulse: 65  Height: 5\' 2"  (1.575 m)  Weight: 166 lb 9.6 oz (75.6 kg)  SpO2: 97%  BMI (Calculated): 30.46    Physical Exam Vitals and nursing note reviewed. Exam conducted with a chaperone present.  Constitutional:      Appearance: Normal appearance.  HENT:     Head: Normocephalic and atraumatic.     Nose: Nose normal.     Mouth/Throat:     Mouth: Mucous membranes are moist.     Pharynx: Oropharynx is clear.  Eyes:     Conjunctiva/sclera: Conjunctivae normal.     Pupils: Pupils are equal, round, and reactive to light.  Cardiovascular:     Rate and Rhythm: Normal rate and regular rhythm.     Pulses: Normal pulses.      Heart sounds: Normal heart sounds. No murmur heard. Pulmonary:     Effort: Pulmonary effort is normal.     Breath sounds: Normal breath sounds. No wheezing.  Chest:  Breasts:    Right: Normal. No swelling, bleeding, inverted nipple, mass, nipple discharge, skin change or tenderness.     Left: Normal. No swelling, bleeding, inverted nipple, mass, nipple discharge, skin change or tenderness.  Abdominal:     General: Bowel sounds are normal.     Palpations: Abdomen is soft.     Tenderness: There is no abdominal tenderness. There is no right CVA tenderness or left CVA tenderness.     Hernia: There is no hernia in the left inguinal area or right inguinal area.  Genitourinary:    Labia:        Right: No rash, tenderness, lesion or injury.        Left: No rash, tenderness, lesion or injury.   Musculoskeletal:        General: Normal range of motion.     Cervical back: Normal range of motion.     Right lower leg: No edema.     Left lower leg: No edema.  Lymphadenopathy:     Upper Body:     Right upper body: No supraclavicular, axillary or pectoral adenopathy.     Left upper body: No supraclavicular, axillary or pectoral adenopathy.     Lower Body: No right inguinal adenopathy. No left inguinal adenopathy.  Skin:    General: Skin is warm and dry.     Findings: No lesion or rash.  Neurological:     General: No focal deficit present.     Mental Status: She is alert and oriented to person, place, and time.  Psychiatric:        Mood and Affect: Mood normal.        Behavior: Behavior normal.      Results for orders placed or performed in visit on 10/26/22  POCT Urinalysis Dipstick (81002)  Result Value Ref Range   Color, UA yellow    Clarity, UA clear    Glucose, UA Negative Negative   Bilirubin, UA negative    Ketones, UA negative    Spec Grav, UA 1.015 1.010 - 1.025   Blood, UA negative    pH, UA 7.0 5.0 - 8.0   Protein, UA Negative Negative   Urobilinogen, UA 0.2 0.2 or  1.0 E.U./dL   Nitrite, UA negative    Leukocytes, UA Trace (A) Negative   Appearance clear    Odor none     Recent Results (from the past 2160 hour(s))  Vitamin D (25 hydroxy)     Status: Abnormal   Collection  Time: 10/06/22 10:27 AM  Result Value Ref Range   Vit D, 25-Hydroxy 12.3 (L) 30.0 - 100.0 ng/mL    Comment: Vitamin D deficiency has been defined by the Institute of Medicine and an Endocrine Society practice guideline as a level of serum 25-OH vitamin D less than 20 ng/mL (1,2). The Endocrine Society went on to further define vitamin D insufficiency as a level between 21 and 29 ng/mL (2). 1. IOM (Institute of Medicine). 2010. Dietary reference    intakes for calcium and D. [place]: The    [employer]. 2. Holick MF, Binkley [place], Bischoff-Ferrari HA, et al.    Evaluation, treatment, and prevention of vitamin D    deficiency: an Endocrine Society clinical practice    guideline. JCEM. 2011 Jul; 96(7):1911-30.   CBC With Diff/Platelet     Status: None   Collection Time: 10/06/22 10:27 AM  Result Value Ref Range   WBC 3.9 3.4 - 10.8 x10E3/uL   RBC 4.13 3.77 - 5.28 x10E6/uL   Hemoglobin 11.6 11.1 - 15.9 g/dL   Hematocrit 57.8 46.9 - 46.6 %   MCV 86 79 - 97 fL   MCH 28.1 26.6 - 33.0 pg   MCHC 32.5 31.5 - 35.7 g/dL   RDW 62.9 52.8 - 41.3 %   Platelets 314 150 - 450 x10E3/uL   Neutrophils 40 Not Estab. %   Lymphs 45 Not Estab. %   Monocytes 9 Not Estab. %   Eos 5 Not Estab. %   Basos 1 Not Estab. %   Neutrophils Absolute 1.6 1.4 - 7.0 x10E3/uL   Lymphocytes Absolute 1.8 0.7 - 3.1 x10E3/uL   Monocytes Absolute 0.3 0.1 - 0.9 x10E3/uL   EOS (ABSOLUTE) 0.2 0.0 - 0.4 x10E3/uL   Basophils Absolute 0.1 0.0 - 0.2 x10E3/uL   Immature Granulocytes 0 Not Estab. %   Immature Grans (Abs) 0.0 0.0 - 0.1 x10E3/uL  CMP14+EGFR     Status: None   Collection Time: 10/06/22 10:27 AM  Result Value Ref Range   Glucose 84 70 - 99 mg/dL   BUN 14 6 - 24 mg/dL   Creatinine,  Ser 2.44 0.57 - 1.00 mg/dL   eGFR 78 >01 UU/VOZ/3.66   BUN/Creatinine Ratio 15 9 - 23   Sodium 139 134 - 144 mmol/L   Potassium 4.5 3.5 - 5.2 mmol/L   Chloride 106 96 - 106 mmol/L   CO2 22 20 - 29 mmol/L   Calcium 9.1 8.7 - 10.2 mg/dL   Total Protein 7.0 6.0 - 8.5 g/dL   Albumin 4.1 3.9 - 4.9 g/dL   Globulin, Total 2.9 1.5 - 4.5 g/dL   Bilirubin Total 0.3 0.0 - 1.2 mg/dL   Alkaline Phosphatase 67 44 - 121 IU/L   AST 19 0 - 40 IU/L   ALT 16 0 - 32 IU/L  Lipid Profile     Status: None   Collection Time: 10/06/22 10:27 AM  Result Value Ref Range   Cholesterol, Total 188 100 - 199 mg/dL   Triglycerides 59 0 - 149 mg/dL   HDL 84 >44 mg/dL   VLDL Cholesterol Cal 11 5 - 40 mg/dL   LDL Chol Calc (NIH) 93 0 - 99 mg/dL   Chol/HDL Ratio 2.2 0.0 - 4.4 ratio    Comment:  T. Chol/HDL Ratio                                             Men  Women                               1/2 Avg.Risk  3.4    3.3                                   Avg.Risk  5.0    4.4                                2X Avg.Risk  9.6    7.1                                3X Avg.Risk 23.4   11.0   POCT Urinalysis Dipstick (16109)     Status: Abnormal   Collection Time: 10/26/22 10:24 AM  Result Value Ref Range   Color, UA yellow    Clarity, UA clear    Glucose, UA Negative Negative   Bilirubin, UA negative    Ketones, UA negative    Spec Grav, UA 1.015 1.010 - 1.025   Blood, UA negative    pH, UA 7.0 5.0 - 8.0   Protein, UA Negative Negative   Urobilinogen, UA 0.2 0.2 or 1.0 E.U./dL   Nitrite, UA negative    Leukocytes, UA Trace (A) Negative   Appearance clear    Odor none       Assessment & Plan:  Patient declines to get her baseline mammogram today.  She has been advised to do her breast exams every month after her previous.  She will continue her vitamin D supplement.  Will repeat vitamin D levels in 3 months. Problem List Items Addressed This Visit     Lipid screening    Relevant Orders   POCT Urinalysis Dipstick (60454) (Completed)   Vitamin D deficiency   Other Visit Diagnoses     Vaginal Pap smear    -  Primary   Relevant Orders   IGP,CtNgTv,Apt HPV,rfx16/18,45   Exposure to sexually transmitted disease (STD)       Relevant Orders   IGP,CtNgTv,Apt HPV,rfx16/18,45   Annual physical exam           Return in about 3 months (around 01/26/2023).   Total time spent: 30 minutes  Margaretann Loveless, MD  10/26/2022   This document may have been prepared by [facility] Voice Recognition software and as such may include unintentional dictation errors.

## 2022-10-30 LAB — IGP,CTNGTV,APT HPV,RFX16/18,45
Chlamydia, Nuc. Acid Amp: NEGATIVE
Gonococcus, Nuc. Acid Amp: NEGATIVE
HPV Aptima: NEGATIVE
PAP Smear Comment: 0
Trich vag by NAA: NEGATIVE

## 2022-10-31 ENCOUNTER — Telehealth: Payer: Self-pay | Admitting: Internal Medicine

## 2022-10-31 NOTE — Progress Notes (Signed)
Spoke with pt, DOB verified and verbalized understanding.

## 2022-10-31 NOTE — Telephone Encounter (Signed)
Patient called in and questioned if her pap was normal. I reassured her that it was per the provider.

## 2023-01-29 ENCOUNTER — Ambulatory Visit: Payer: BC Managed Care – PPO | Admitting: Internal Medicine

## 2023-03-01 ENCOUNTER — Other Ambulatory Visit: Payer: Self-pay | Admitting: Internal Medicine

## 2023-03-01 DIAGNOSIS — Z1231 Encounter for screening mammogram for malignant neoplasm of breast: Secondary | ICD-10-CM
